# Patient Record
Sex: Female | Born: 1970 | Race: White | Hispanic: Yes | Marital: Married | Smoking: Never smoker
Health system: Southern US, Community
[De-identification: ages and names within clinical notes are randomized; demographics above are authoritative.]

## PROBLEM LIST (undated history)

## (undated) DIAGNOSIS — T7840XA Allergy, unspecified, initial encounter: Secondary | ICD-10-CM

## (undated) HISTORY — DX: Allergy, unspecified, initial encounter: T78.40XA

---

## 1998-10-08 ENCOUNTER — Ambulatory Visit (HOSPITAL_COMMUNITY): Admission: RE | Admit: 1998-10-08 | Discharge: 1998-10-08 | Payer: Self-pay | Admitting: Obstetrics

## 1998-10-08 ENCOUNTER — Other Ambulatory Visit: Admission: RE | Admit: 1998-10-08 | Discharge: 1998-10-08 | Payer: Self-pay | Admitting: Obstetrics

## 1999-01-22 ENCOUNTER — Ambulatory Visit (HOSPITAL_COMMUNITY): Admission: RE | Admit: 1999-01-22 | Discharge: 1999-01-22 | Payer: Self-pay | Admitting: Obstetrics

## 1999-01-22 ENCOUNTER — Encounter: Payer: Self-pay | Admitting: Obstetrics

## 1999-03-18 ENCOUNTER — Inpatient Hospital Stay (HOSPITAL_COMMUNITY): Admission: AD | Admit: 1999-03-18 | Discharge: 1999-03-20 | Payer: Self-pay | Admitting: *Deleted

## 2002-05-18 ENCOUNTER — Inpatient Hospital Stay (HOSPITAL_COMMUNITY): Admission: AD | Admit: 2002-05-18 | Discharge: 2002-05-20 | Payer: Self-pay | Admitting: Obstetrics and Gynecology

## 2005-05-10 ENCOUNTER — Inpatient Hospital Stay (HOSPITAL_COMMUNITY): Admission: AD | Admit: 2005-05-10 | Discharge: 2005-05-12 | Payer: Self-pay | Admitting: Obstetrics

## 2005-05-11 ENCOUNTER — Encounter (INDEPENDENT_AMBULATORY_CARE_PROVIDER_SITE_OTHER): Payer: Self-pay | Admitting: Specialist

## 2009-02-01 ENCOUNTER — Emergency Department (HOSPITAL_COMMUNITY): Admission: EM | Admit: 2009-02-01 | Discharge: 2009-02-01 | Payer: Self-pay | Admitting: Family Medicine

## 2011-02-14 NOTE — Op Note (Signed)
Ashley Blackburn, VOSLER              ACCOUNT NO.:  [id number]   MEDICAL RECORD NO.:  [id number]          PATIENT TYPE:  INP   LOCATION:  9145                          FACILITY:  [facility]   PHYSICIAN:  Kathreen Cosier, M.D.DATE OF BIRTH:  November 16, 1970   DATE OF PROCEDURE:  05/11/2005  DATE OF DISCHARGE:                                 OPERATIVE REPORT   PREOPERATIVE DIAGNOSIS:  Multiparity.   OPERATION/PROCEDURE:  Postpartum tubal ligation.   ANESTHESIA:  Epidural.   DESCRIPTION OF PROCEDURE:  The patient was placed in the supine position,  abdomen was prepped and draped.  Bladder emptied with straight catheter.  A  one-inch midline subumbilical incision made, carried down to the fascia.  Fascia was cleaned and grasped with two Kochers.  The fascia and peritoneum  opened with the Mayo scissors.  The left tube was grasped in the mid portion  with the Babcock clamp.  A 0 plain suture on the mesosalpinx below the  portion of the tube within the clamp.  This was tied.  Approximately one  inch of tube was transected.  Hemostasis satisfactory.  Procedure was done  in similar fashion on the other side.  Lap and sponge counts correct.  Abdomen closed in layers.  Peritoneum and fascia closed with continuous #1  Dexon.  Skin closed with subcuticular stitch of 4-0 Monocryl.  Blood loss  less than 5 mL.  The patient tolerated the procedure well and taken to the  recovery room in good condition.       BAM/MEDQ  D:  05/11/2005  T:  05/12/2005  Job:  [id number]

## 2011-02-14 NOTE — Discharge Summary (Signed)
NAMEJENNAVECIA, SCHWIER              ACCOUNT NO.:  [id number]   MEDICAL RECORD NO.:  [id number]          PATIENT TYPE:  INP   LOCATION:  9145                          FACILITY:  [facility]   PHYSICIAN:  Kathreen Cosier, M.D.DATE OF BIRTH:  1971/06/11   DATE OF ADMISSION:  05/10/2005  DATE OF DISCHARGE:                                 DISCHARGE SUMMARY   The patient is a 40 year old gravida 4, para 0, 0-0-3 with EDC of August, 8,  2006 chosen for induction at 41 weeks.  Cervix 1 cm, 90%, vertex, -3.  Negative GBS.  The patient had a normal vaginal delivery on May 10, 2005.  Apgar 8, 9, female.  Desired sterilization.  On May 11, 2005, underwent  postpartum tubal ligation.  Postoperatively, her hemoglobin was 12.7.  She  was discharged on May 12, 2005, the second postpartum day, ambulatory, on  a regular diet.  To see me in six weeks.   DISCHARGE DIAGNOSES:  1.  Status post normal vaginal delivery at term.  2.  Postpartum tubal ligation.       BAM/MEDQ  D:  05/12/2005  T:  05/12/2005  Job:  [id number]

## 2012-02-19 ENCOUNTER — Ambulatory Visit (INDEPENDENT_AMBULATORY_CARE_PROVIDER_SITE_OTHER): Payer: BC Managed Care – PPO | Admitting: Family Medicine

## 2012-02-19 VITALS — BP 134/86 | HR 76 | Temp 98.5°F | Resp 16 | Ht 60.75 in | Wt 175.4 lb

## 2012-02-19 DIAGNOSIS — J45909 Unspecified asthma, uncomplicated: Secondary | ICD-10-CM

## 2012-02-19 MED ORDER — METHYLPREDNISOLONE ACETATE 80 MG/ML IJ SUSP
80.0000 mg | Freq: Once | INTRAMUSCULAR | Status: AC
  Administered 2012-02-19: 80 mg via INTRAMUSCULAR

## 2012-02-19 NOTE — Progress Notes (Signed)
  Subjective:    Patient ID: Ashley Blackburn, female    DOB: [birth year], 41 y.o.   MRN: [id number]  HPI 41 yo female here with URI symptoms. Difficulty with allergies.  RUnny nose, sob (uses inhaler), eyes irritated.  SOB will cause cough.  No fever.  Bodyaches.  Going on for about a month.  Nasal congestion and eyes started several days ago.  Allegra helps some.    Review of Systems    Negative except as per HPI  Objective:   Physical Exam  Constitutional: She appears well-developed. No distress.  HENT:  Right Ear: Tympanic membrane, external ear and ear canal normal. Tympanic membrane is not injected, not scarred, not perforated, not erythematous, not retracted and not bulging.  Left Ear: Tympanic membrane, external ear and ear canal normal. Tympanic membrane is not injected, not scarred, not perforated, not erythematous, not retracted and not bulging.  Nose: Mucosal edema present. No rhinorrhea. Right sinus exhibits no maxillary sinus tenderness and no frontal sinus tenderness. Left sinus exhibits maxillary sinus tenderness. Left sinus exhibits no frontal sinus tenderness.  Mouth/Throat: Uvula is midline, oropharynx is clear and moist and mucous membranes are normal. No oropharyngeal exudate or tonsillar abscesses.  Cardiovascular: Normal rate, regular rhythm, normal heart sounds and intact distal pulses.   No murmur heard. Pulmonary/Chest: Effort normal and breath sounds normal. No respiratory distress. She has no wheezes. She has no rales.  Lymphadenopathy:       Head (right side): No submandibular and no preauricular adenopathy present.       Head (left side): No submandibular and no preauricular adenopathy present.       Right cervical: No superficial cervical and no posterior cervical adenopathy present.      Left cervical: No superficial cervical and no posterior cervical adenopathy present.       Right: No supraclavicular adenopathy present.       Left: No supraclavicular  adenopathy present.  Skin: Skin is warm and dry.          Assessment & Plan:  Allergies, asthma - depomedrol here.  Continue allegra

## 2013-01-21 ENCOUNTER — Ambulatory Visit (INDEPENDENT_AMBULATORY_CARE_PROVIDER_SITE_OTHER): Payer: BC Managed Care – PPO | Admitting: Emergency Medicine

## 2013-01-21 VITALS — BP 100/80 | HR 77 | Temp 98.0°F | Resp 18 | Wt 181.0 lb

## 2013-01-21 DIAGNOSIS — J309 Allergic rhinitis, unspecified: Secondary | ICD-10-CM

## 2013-01-21 DIAGNOSIS — J209 Acute bronchitis, unspecified: Secondary | ICD-10-CM

## 2013-01-21 MED ORDER — FLUTICASONE PROPIONATE 50 MCG/ACT NA SUSP: 2.0000 | Freq: Every day | NASAL | Status: AC

## 2013-01-21 MED ORDER — ALBUTEROL SULFATE HFA 108 (90 BASE) MCG/ACT IN AERS: 2.0000 | INHALATION_SPRAY | RESPIRATORY_TRACT | Status: AC | PRN

## 2013-01-21 NOTE — Patient Instructions (Addendum)
Rinitis Alrgica (Allergic Rhinitis) La rinitis alrgica aparece cuando las membranas mucosas de la nariz reaccionan a los alrgenos. Los alrgenos son las partculas que estn en el aire y a las que el organismo responde cuando existe una reaccin alrgica. Esto hace que usted libere anticuerpos de alergia. A travs de una sucesin de procesos, finalmente se libera histamina (de ah el uso de antihistamnicos) en el torrente sanguneo. Aunque esto implica una proteccin para su organismo, es lo que le produce las molestias., como estornudos frecuentes, congestin, picazn y goteos de la nariz.  CAUSAS Los alergenos del polen pueden provenir del csped, rboles y hierbas. Esto produce la rinitis alrgica estacional, o "fiebre de heno". Otras alrgenos pueden ocasionar rinitis alrgica persistente (rinitis alrgica perenne) como aquellos que contienen los caros del polvo del hogar, el pelaje de las mascotas y las esporas del moho.  SNTOMAS  Congestin nasal.  Picazn y goteo de la nariz con estornudos y lagrimeo de los ojos.  Generalmente, tambin puede haber picazn de la boca, ojos y odos. Las alergias no pueden curarse pero pueden controlarse con medicamentos. DIAGNSTICO Si no reconoce exactamente cul es el alrgeno que le ocasiona el problema, podrn realizarle pruebas de sangre, o de piel para determinarlo. TRATAMIENTO  Evite el alrgeno.  Podrn ser tiles medicamentos y vacunas para la alergia (inmunoterapia).  Con frecuencia la fiebre de heno se trata simplemente con antihistamnicos en forma de pldoras o sprays nasales. Los antihistamnicos bloquean los efectos de la histamina. Existen medicamentos de venta libre que lo ayudarn a aliviar la picazn, la congestin nasal y la hinchazn alrededor de los ojos. Consulte con el profesional antes de tomar o administrar estos medicamentos. Si estos medicamentos no le resultan efectivos, existen muchos otros nuevos que el profesional que  lo asiste puede prescribirle. Si las medidas iniciales no son efectivas, podrn utilizarse medicamentos ms fuertes. Las inyecciones desensibilizantes pueden utilizarse si los otros medicamentos fracasan. La desensibilizacin aparece cuando un paciente recibe inyecciones continuas hasta que el cuerpo se vuelve menos sensible al alrgeno. Asegrese de realizar un seguimiento con el profesional que lo asiste si los problemas continan. SOLICITE ANTENCIN MDICA SI:   Le sube la temperatura a ms de 100.5 F (38.1 C).  Presenta tos que no se alivia (persistente).  Le falta el aire.  Comienza a respirar con dificultad.  Los sntomas interfieren con las actividades diarias. Document Released: 06/25/2005 Document Revised: 12/08/2011 [employer] Patient Information 2013 [employer].  

## 2013-01-21 NOTE — Progress Notes (Signed)
[facility] [address] [place] [address] [phone]  Date:  01/21/2013   Name:  Ashley Blackburn   DOB:  Dec 20, 1970   MRN:  [id number]  PCP:  No PCP Per Patient    Chief Complaint: Allergic Rhinitis    History of Present Illness:  Ashley Blackburn is a 42 y.o. very pleasant female patient who presents with the following:  Ill since yesterday with cough and nasal congestion.  Has some exertional shortness of breath and wheezing.  Nasal discharge is clear.  Cough is non productive.  No fever or chills.  No nausea or vomiting.  No stool change.  No improvement with over the counter medications or other home remedies. Denies other complaint or health concern today.   There is no problem list on file for this patient.   Past Medical History  Diagnosis Date  . Allergy     No past surgical history on file.  History  Substance Use Topics  . Smoking status: Never Smoker   . Smokeless tobacco: Not on file  . Alcohol Use: No    No family history on file.  No Known Allergies  Medication list has been reviewed and updated.  Current Outpatient Prescriptions on File Prior to Visit  Medication Sig Dispense Refill  . albuterol (PROVENTIL HFA;VENTOLIN HFA) 108 (90 BASE) MCG/ACT inhaler Inhale 2 puffs into the lungs as needed.       No current facility-administered medications on file prior to visit.    Review of Systems:  As per HPI, otherwise negative.    Physical Examination: Filed Vitals:   01/21/13 1733  BP: 100/80  Pulse: 77  Temp: 98 F (36.7 C)  Resp: 18   Filed Vitals:   01/21/13 1733  Weight: 181 lb (82.101 kg)   Body mass index is 34.48 kg/(m^2). Ideal Body Weight:    GEN: WDWN, NAD, Non-toxic, A & O x 3 HEENT: Atraumatic, Normocephalic. Neck supple. No masses, No LAD. Ears and Nose: No external deformity. CV: RRR, No M/G/R. No JVD. No thrill. No extra heart sounds. PULM: CTA B,  Diffuse wheezes, no  crackles, rhonchi. No  retractions. No resp. distress. No accessory muscle use. ABD: S, NT, ND, +BS. No rebound. No HSM. EXTR: No c/c/e NEURO Normal gait.  PSYCH: Normally interactive. Conversant. Not depressed or anxious appearing.  Calm demeanor.    Assessment and Plan: Seasonal allergic rhinitis Bronchospasm Albuterol flonase Continue claritin  Signed,  Phillips Odor, MD

## 2019-03-09 ENCOUNTER — Other Ambulatory Visit: Payer: Self-pay

## 2021-01-30 ENCOUNTER — Other Ambulatory Visit: Payer: Self-pay | Admitting: Internal Medicine

## 2021-01-30 DIAGNOSIS — Z1231 Encounter for screening mammogram for malignant neoplasm of breast: Secondary | ICD-10-CM

## 2021-02-27 ENCOUNTER — Ambulatory Visit: Payer: Self-pay | Admitting: Women's Health

## 2021-03-09 ENCOUNTER — Encounter (HOSPITAL_COMMUNITY): Payer: Self-pay | Admitting: Emergency Medicine

## 2021-03-09 ENCOUNTER — Other Ambulatory Visit: Payer: Self-pay

## 2021-03-09 ENCOUNTER — Emergency Department (HOSPITAL_COMMUNITY)
Admission: EM | Admit: 2021-03-09 | Discharge: 2021-03-10 | Disposition: A | Discharge status: Home / Self Care | Payer: BC Managed Care – PPO | Attending: Emergency Medicine | Admitting: Emergency Medicine

## 2021-03-09 ENCOUNTER — Ambulatory Visit (HOSPITAL_COMMUNITY)
Admission: EM | Admit: 2021-03-09 | Discharge: 2021-03-09 | Disposition: A | Discharge status: Home / Self Care | Payer: BC Managed Care – PPO | Attending: Urgent Care | Admitting: Urgent Care

## 2021-03-09 ENCOUNTER — Emergency Department (HOSPITAL_COMMUNITY): Payer: BC Managed Care – PPO

## 2021-03-09 DIAGNOSIS — R1011 Right upper quadrant pain: Secondary | ICD-10-CM

## 2021-03-09 DIAGNOSIS — R1013 Epigastric pain: Secondary | ICD-10-CM

## 2021-03-09 DIAGNOSIS — K8020 Calculus of gallbladder without cholecystitis without obstruction: Secondary | ICD-10-CM | POA: Diagnosis not present

## 2021-03-09 LAB — CBC WITH DIFFERENTIAL/PLATELET
Abs Immature Granulocytes: 0.02 10*3/uL (ref 0.00–0.07)
Basophils Absolute: 0 10*3/uL (ref 0.0–0.1)
Basophils Relative: 1 %
Eosinophils Absolute: 0.2 10*3/uL (ref 0.0–0.5)
Eosinophils Relative: 2 %
HCT: 43 % (ref 36.0–46.0)
Hemoglobin: 14.2 g/dL (ref 12.0–15.0)
Immature Granulocytes: 0 %
Lymphocytes Relative: 29 %
Lymphs Abs: 2.4 10*3/uL (ref 0.7–4.0)
MCH: 30 pg (ref 26.0–34.0)
MCHC: 33 g/dL (ref 30.0–36.0)
MCV: 90.7 fL (ref 80.0–100.0)
Monocytes Absolute: 0.6 10*3/uL (ref 0.1–1.0)
Monocytes Relative: 7 %
Neutro Abs: 4.9 10*3/uL (ref 1.7–7.7)
Neutrophils Relative %: 61 %
Platelets: 314 10*3/uL (ref 150–400)
RBC: 4.74 MIL/uL (ref 3.87–5.11)
RDW: 13.3 % (ref 11.5–15.5)
WBC: 8.1 10*3/uL (ref 4.0–10.5)
nRBC: 0 % (ref 0.0–0.2)

## 2021-03-09 LAB — URINALYSIS, ROUTINE W REFLEX MICROSCOPIC
Bacteria, UA: NONE SEEN
Bilirubin Urine: NEGATIVE
Glucose, UA: NEGATIVE mg/dL
Ketones, ur: NEGATIVE mg/dL
Leukocytes,Ua: NEGATIVE
Nitrite: NEGATIVE
Protein, ur: NEGATIVE mg/dL
RBC / HPF: >50 RBC/hpf — ABNORMAL HIGH (ref 0–5)
Specific Gravity, Urine: 1.015 (ref 1.005–1.030)
pH: 5 (ref 5.0–8.0)

## 2021-03-09 LAB — COMPREHENSIVE METABOLIC PANEL
ALT: 18 U/L (ref 0–44)
AST: 17 U/L (ref 15–41)
Albumin: 3.9 g/dL (ref 3.5–5.0)
Alkaline Phosphatase: 64 U/L (ref 38–126)
Anion gap: 6 (ref 5–15)
BUN: 7 mg/dL (ref 6–20)
CO2: 29 mmol/L (ref 22–32)
Calcium: 9.1 mg/dL (ref 8.9–10.3)
Chloride: 102 mmol/L (ref 98–111)
Creatinine, Ser: 0.61 mg/dL (ref 0.44–1.00)
GFR, Estimated: >60 mL/min (ref >60)
Glucose, Bld: 93 mg/dL (ref 70–99)
Potassium: 4.1 mmol/L (ref 3.5–5.1)
Sodium: 137 mmol/L (ref 135–145)
Total Bilirubin: 1.1 mg/dL (ref 0.3–1.2)
Total Protein: 6.9 g/dL (ref 6.5–8.1)

## 2021-03-09 LAB — I-STAT BETA HCG BLOOD, ED (MC, WL, AP ONLY): I-stat hCG, quantitative: <5 m[IU]/mL (ref <5)

## 2021-03-09 LAB — LIPASE, BLOOD: Lipase: 22 U/L (ref 11–51)

## 2021-03-09 MED ORDER — HYDROCODONE-ACETAMINOPHEN 5-325 MG PO TABS
1.0000 | ORAL_TABLET | Freq: Once | ORAL | Status: AC
  Administered 2021-03-09: 1 via ORAL

## 2021-03-09 MED ORDER — HYDROCODONE-ACETAMINOPHEN 5-325 MG PO TABS
ORAL_TABLET | ORAL | Status: AC
  Filled 2021-03-09: qty 1

## 2021-03-09 MED ORDER — FENTANYL CITRATE (PF) 100 MCG/2ML IJ SOLN
75.0000 ug | Freq: Once | INTRAMUSCULAR | Status: AC
Start: 2021-03-09 — End: 2021-03-09
  Administered 2021-03-09: 75 ug via INTRAVENOUS
  Filled 2021-03-09: qty 2

## 2021-03-09 MED ORDER — ONDANSETRON HCL 4 MG/2ML IJ SOLN
4.0000 mg | Freq: Once | INTRAMUSCULAR | Status: AC
Start: 2021-03-09 — End: 2021-03-09
  Administered 2021-03-09: 4 mg via INTRAVENOUS
  Filled 2021-03-09: qty 2

## 2021-03-09 MED ORDER — HYDROCODONE-ACETAMINOPHEN 5-325 MG PO TABS
1.0000 | ORAL_TABLET | Freq: Once | ORAL | Status: AC
Start: 2021-03-09 — End: 2021-03-10
  Administered 2021-03-10: 1 via ORAL
  Filled 2021-03-09: qty 1

## 2021-03-09 MED ORDER — HYDROCODONE-ACETAMINOPHEN 5-325 MG PO TABS: 1.0000 | ORAL_TABLET | Freq: Three times a day (TID) | ORAL | 0 refills | Status: AC | PRN

## 2021-03-09 MED ORDER — KETOROLAC TROMETHAMINE 30 MG/ML IJ SOLN
30.0000 mg | Freq: Once | INTRAMUSCULAR | Status: AC
  Administered 2021-03-09: 30 mg via INTRAVENOUS
  Filled 2021-03-09: qty 1

## 2021-03-09 MED ORDER — ONDANSETRON 4 MG PO TBDP: 4.0000 mg | ORAL_TABLET | Freq: Three times a day (TID) | ORAL | 0 refills | Status: AC | PRN

## 2021-03-09 NOTE — ED Triage Notes (Signed)
Pt endorses mid and right sided abd pain intermittently that worsened yesterday. Endorses n/v

## 2021-03-09 NOTE — ED Provider Notes (Signed)
[facility]  MRN: [id number] DOB: 1971-05-19  Subjective:   Ashley Blackburn is a 50 y.o. female presenting for 3 to 4-day history of acute onset severe 9 out of 10 abdominal pain over the right upper quadrant, right flank side.  Patient has a difficult time taking a deep breath, pain is elicited and worsened after eating.  She has concerns about gallbladder disease and has seen multiple providers for this but reports that no one has ever done blood work or imaging.  Also has nausea without vomiting.  Denies fever, diarrhea, bloody stools, history of GI issues, cough, chest pain, shortness of breath.  Patient does not practice a healthy diet.  No current facility-administered medications for this encounter.  Current Outpatient Medications:    albuterol (PROVENTIL HFA;VENTOLIN HFA) 108 (90 BASE) MCG/ACT inhaler, Inhale 2 puffs into the lungs every 4 (four) hours as needed., Disp: 18 g, Rfl: 12   fluticasone (FLONASE) 50 MCG/ACT nasal spray, Place 2 sprays into the nose daily., Disp: 16 g, Rfl: 12   loratadine (CLARITIN) 10 MG tablet, Take 10 mg by mouth daily., Disp: , Rfl:    No Known Allergies  Past Medical History:  Diagnosis Date   Allergy      History reviewed. No pertinent surgical history.  History reviewed. No pertinent family history.  Social History   Tobacco Use   Smoking status: Never  Substance Use Topics   Alcohol use: No   Drug use: No    ROS   Objective:   Vitals: BP 135/85 (BP Location: Right Arm)   Pulse 72   Resp 16   LMP 03/08/2021 (Exact Date)   SpO2 98%   Physical Exam Constitutional:      General: She is not in acute distress.    Appearance: Normal appearance. She is well-developed. She is obese. She is not ill-appearing, toxic-appearing or diaphoretic.  HENT:     Head: Normocephalic and atraumatic.     Right Ear: External ear normal.     Left Ear: External ear normal.     Nose: Nose normal.     Mouth/Throat:     Mouth:  Mucous membranes are moist.     Pharynx: Oropharynx is clear.  Eyes:     General: No scleral icterus.    Extraocular Movements: Extraocular movements intact.     Pupils: Pupils are equal, round, and reactive to light.  Cardiovascular:     Rate and Rhythm: Normal rate and regular rhythm.     Pulses: Normal pulses.     Heart sounds: Normal heart sounds. No murmur heard.   No friction rub. No gallop.  Pulmonary:     Effort: Pulmonary effort is normal. No respiratory distress.     Breath sounds: Normal breath sounds. No stridor. No wheezing, rhonchi or rales.  Abdominal:     General: Bowel sounds are normal. There is no distension.     Palpations: Abdomen is soft. There is no mass.     Tenderness: There is abdominal tenderness in the right upper quadrant and right lower quadrant. There is no right CVA tenderness, left CVA tenderness, guarding or rebound.  Skin:    General: Skin is warm and dry.     Coloration: Skin is not pale.     Findings: No rash.  Neurological:     General: No focal deficit present.     Mental Status: She is alert and oriented to person, place, and time.  Psychiatric:  Mood and Affect: Mood normal.        Behavior: Behavior normal.        Thought Content: Thought content normal.        Judgment: Judgment normal.      Assessment and Plan :   PDMP not reviewed this encounter.  1. Right upper quadrant abdominal pain     Patient has severe abdominal pain and guarding on exam.  I offered her a comprehensive metabolic panel but patient prefers imaging which we cannot perform.  Recommended evaluation in the emergency room for further work-up and rule out of an acute abdomen, acute gallbladder disease.  Patient given hydrocodone in clinic for severe pain, will report to the emergency room by personal vehicle.   Wallis Bamberg, [place] 03/09/21 1405

## 2021-03-09 NOTE — ED Triage Notes (Signed)
Pt said she has been having RUQ pain that radiates around to her back. Pt said when she eats it makes it worse. Some nausea, no vomiting.

## 2021-03-09 NOTE — ED Provider Notes (Signed)
Emergency Medicine Provider Triage Evaluation Note  Ashley Blackburn , a 50 y.o. female  was evaluated in triage.  Pt complains of epigastric RUQ abdominal pain, nausea, vomiting 3 days. History of similar pain in the past that has gone away on its own. No abdominal surgeries. No fever, CP, cough, diarrhea, dysuria.   Review of Systems  Positive: Abd pain nv Negative: Fever CP SOB flank pain dysuria diarrhea  Physical Exam  BP 134/86 (BP Location: Left Arm)   Pulse 64   Temp 98.3 F (36.8 C) (Oral)   Resp 18   LMP 03/08/2021 (Exact Date)   SpO2 97%  Gen:   Awake, no distress   Resp:  Normal effort  MSK:   Moves extremities without difficulty  Abd:  RUQ epigastric abd tenderness  Medical Decision Making  Medically screening exam initiated at 2:31 PM.  Appropriate orders placed.     Patient made aware this encounter is a triage and screening encounter and no beds are immediately available at this time in the ER.  Patient was informed that the remainder of the evaluation will be completed by another provider.  Patient made aware triage orders have been placed and patient will be placed in the waiting room while work up is initiated and until a room becomes available. Patient encouraged to await a formal ER encounter with a clinician.  Patient made aware that exiting the department prior to formal encounter with an ER clinician and completion of the work-up is considered leaving against medical advice.  At that time there is no guarantee that there are no emergency medical conditions present and patient assumes risks of leaving including worsening condition, permanent disability and death. Patient verbalizes understanding.     Liberty Handy, PA-C 03/09/21 1432    Benjiman Core, MD 03/09/21 [phone]

## 2021-03-09 NOTE — ED Provider Notes (Signed)
[facility] EMERGENCY DEPARTMENT Provider Note   CSN: [id number] Arrival date & time: 03/09/21  1346     History Chief Complaint  Patient presents with   Abdominal Pain    Ashley Blackburn is a 50 y.o. female presents for evaluation of right upper quadrant abdominal pain that began yesterday.  She reports she was at work when the pain started.  She had eaten rice and fried chicken.  She states that the pain has been constant since then.  Currently rates it at a 7/10.  She has had some nausea/vomiting.  She reports she has had episodes of this pain intermittently over the last several months but yesterday's episode was worse.  She has not sought evaluation for her symptoms.  She has not had any fevers, denies any chest pain, difficulty breathing, dysuria, hematuria.  She is currently on her menstrual cycle.  The history is provided by the patient.      Past Medical History:  Diagnosis Date   Allergy     There are no problems to display for this patient.   History reviewed. No pertinent surgical history.   OB History   No obstetric history on file.     No family history on file.  Social History   Tobacco Use   Smoking status: Never  Substance Use Topics   Alcohol use: No   Drug use: No    Home Medications Prior to Admission medications   Medication Sig Start Date End Date Taking? Authorizing Provider  HYDROcodone-acetaminophen (NORCO/VICODIN) 5-325 MG tablet Take 1 tablet by mouth every 8 (eight) hours as needed. 03/09/21  Yes Graciella Freer A, PA-C  ondansetron (ZOFRAN ODT) 4 MG disintegrating tablet Take 1 tablet (4 mg total) by mouth every 8 (eight) hours as needed for nausea or vomiting. 03/09/21  Yes Graciella Freer A, PA-C  albuterol (PROVENTIL HFA;VENTOLIN HFA) 108 (90 BASE) MCG/ACT inhaler Inhale 2 puffs into the lungs every 4 (four) hours as needed. 01/21/13   Carmelina Dane, MD  fluticasone [facility]) 50 MCG/ACT nasal spray Place 2 sprays  into the nose daily. 01/21/13   Carmelina Dane, MD  loratadine (CLARITIN) 10 MG tablet Take 10 mg by mouth daily.    [provider]    Allergies    Patient has no known allergies.  Review of Systems   Review of Systems  Constitutional:  Negative for fever.  Respiratory:  Negative for cough and shortness of breath.   Cardiovascular:  Negative for chest pain.  Gastrointestinal:  Positive for abdominal pain, nausea and vomiting.  Genitourinary:  Negative for dysuria and hematuria.  Neurological:  Negative for headaches.  All other systems reviewed and are negative.  Physical Exam Updated Vital Signs BP 135/79   Pulse (!) 59   Temp 97.7 F (36.5 C)   Resp 19   LMP 03/08/2021 (Exact Date)   SpO2 93%   Physical Exam Vitals and nursing note reviewed.  Constitutional:      Appearance: Normal appearance. She is well-developed.  HENT:     Head: Normocephalic and atraumatic.  Eyes:     General: Lids are normal.     Conjunctiva/sclera: Conjunctivae normal.     Pupils: Pupils are equal, round, and reactive to light.  Cardiovascular:     Rate and Rhythm: Normal rate and regular rhythm.     Pulses: Normal pulses.     Heart sounds: Normal heart sounds. No murmur heard.   No friction rub. No gallop.  Pulmonary:     Effort: Pulmonary effort is normal.     Breath sounds: Normal breath sounds.     Comments: Lungs clear to auscultation bilaterally.  Symmetric chest rise.  No wheezing, rales, rhonchi. Abdominal:     Palpations: Abdomen is soft. Abdomen is not rigid.     Tenderness: There is abdominal tenderness in the right upper quadrant. There is no right CVA tenderness, left CVA tenderness or guarding. Negative signs include McBurney's sign.     Comments: Abdomen is soft, nondistended.  Tenderness palpation in right upper quadrant.  Mild Murphy sign.  No CVA tenderness noted bilaterally.  Musculoskeletal:        General: Normal range of motion.     Cervical back: Full  passive range of motion without pain.  Skin:    General: Skin is warm and dry.     Capillary Refill: Capillary refill takes less than 2 seconds.  Neurological:     Mental Status: She is alert and oriented to person, place, and time.  Psychiatric:        Speech: Speech normal.    ED Results / Procedures / Treatments   Labs (all labs ordered are listed, but only abnormal results are displayed) Labs Reviewed  URINALYSIS, ROUTINE W REFLEX MICROSCOPIC - Abnormal; Notable for the following components:      Result Value   APPearance HAZY (*)    Hgb urine dipstick LARGE (*)    RBC / HPF >50 (*)    All other components within normal limits  CBC WITH DIFFERENTIAL/PLATELET  COMPREHENSIVE METABOLIC PANEL  LIPASE, BLOOD  I-STAT BETA HCG BLOOD, ED ([facility], [facility], [facility] ONLY)    EKG None  Radiology US Abdomen Limited RUQ (LIVER/GB)  Result Date: 03/09/2021 CLINICAL DATA:  Epigastric pain for 4 days. EXAM: ULTRASOUND ABDOMEN LIMITED RIGHT UPPER QUADRANT COMPARISON:  None. FINDINGS: Gallbladder: Calcified gallstones are noted within the gallbladder lumen. No associated gallbladder wall thickening or pericholecystic fluid visualized. No sonographic Murphy sign noted by sonographer. Common bile duct: Diameter: 3 mm. Liver: No focal lesion identified. Increased parenchymal echogenicity. Portal vein is patent on color Doppler imaging with normal direction of blood flow towards the liver. Other: None. IMPRESSION: 1. Cholelithiasis with no associated acute cholecystitis or choledocholithiasis. 2. Hepatic steatosis. Please note limited evaluation for focal hepatic masses in a patient with hepatic steatosis due to decreased penetration of the acoustic ultrasound waves. Electronically Signed   By: Tish Frederickson M.D.   On: 03/09/2021 15:16    Procedures Procedures   Medications Ordered in ED Medications  fentaNYL (SUBLIMAZE) injection 75 mcg (75 mcg Intravenous Given 03/09/21 2216)  ondansetron (ZOFRAN)  injection 4 mg (4 mg Intravenous Given 03/09/21 2215)  ketorolac (TORADOL) 30 MG/ML injection 30 mg (30 mg Intravenous Given 03/09/21 2224)  HYDROcodone-acetaminophen (NORCO/VICODIN) 5-325 MG per tablet 1 tablet (1 tablet Oral Given 03/10/21 0035)    ED Course  I have reviewed the triage vital signs and the nursing notes.  Pertinent labs & imaging results that were available during my care of the patient were reviewed by me and considered in my medical decision making (see chart for details).    MDM Rules/Calculators/A&P                          50 year old female who presents for evaluation of right upper quadrant abdominal pain, nausea/vomiting that began yesterday.  Has had intermittent episodes of this that have occurred over the last  several months.  This was the worst episode.  No fevers, urinary complaints.  Went to urgent care initially and got something for pain which she states improved it.  Now states pain is 7 out of 10.  On initial ED arrival, she is afebrile, nontoxic-appearing.  Vital signs are stable.  On exam, she has tenderness palpation noted to the right upper quadrant.  No CVA tenderness that would be concerning for kidney stone.  Consider hepatobiliary etiology.  Labs, ultrasound ordered at triage.  UA shows large hemoglobin.  No leukocytosis, pyuria.  Patient is currently on her menstrual cycle.  I-STAT beta is negative.  Lipase is normal.  CMP is unremarkable.  LFTs within normal limit.  CBC shows no leukocytosis or anemia.  Ultrasound shows cholelithiasis with no associated acute cholecystitis or choledocholithiasis.  Common bile duct is 3 mm.  Discussed results with patient.  At this time, she exhibits no signs of acute cholecystitis.  She is feeling better after pain medication.  We will plan to p.o. challenge.  Reevaluation.  Patient able to tolerate p.o.  Repeat abdominal exam is improved.  At this time, she is hemodynamically stable, afebrile, no leukocytosis with  reassuring signs on ultrasound.  We discussed extensively of diet measures to help with gallbladder pain.  We will give her pain medication to go home with as well as outpatient referral to general surgery. At this time, patient exhibits no emergent life-threatening condition that require further evaluation in ED. Patient had ample opportunity for questions and discussion. All patient's questions were answered with full understanding. Strict return precautions discussed. Patient expresses understanding and agreement to plan.   Portions of this note were generated with Scientist, clinical (histocompatibility and immunogenetics). Dictation errors may occur despite best attempts at proofreading.  Final Clinical Impression(s) / ED Diagnoses Final diagnoses:  Epigastric abdominal pain  Gallstones    Rx / DC Orders ED Discharge Orders          Ordered    HYDROcodone-acetaminophen (NORCO/VICODIN) 5-325 MG tablet  Every 8 hours PRN        03/09/21 2348    ondansetron (ZOFRAN ODT) 4 MG disintegrating tablet  Every 8 hours PRN        03/09/21 2348             Maxwell Caul, PA-C 03/10/21 1513    Jacalyn Lefevre, MD 03/10/21 1933

## 2021-03-09 NOTE — Discharge Instructions (Signed)
I am concerned that you may have an acute gall bladder problem and are in need of a higher level of care than we can provide in the urgent care setting. Please present to the emergency room for consideration of a CT abdomen or RUQ U/S to further rule out an acute abdomen, acute gall bladder problem.

## 2021-03-09 NOTE — ED Notes (Signed)
Patient is being discharged from the Urgent Care and sent to the Emergency Department via POV . Per Marlowe Shores, PA, patient is in need of higher level of care due to severe abdominal pain. Patient is aware and verbalizes understanding of plan of care.  Vitals:   03/09/21 1305  BP: 135/85  Pulse: 72  Resp: 16  SpO2: 98%

## 2021-03-09 NOTE — Discharge Instructions (Addendum)
As we discussed today, your labs and ultrasound were reassuring.  Your ultrasound did show evidence of gallstones which is likely contributing to your pain.  As we discussed at this time, there is no signs of infection.  As we discussed, changing eating habits can help with gallbladder pain.  We have provided you with pain medication and nausea medication that you can have if you start having this pain.  Additionally, we have given you referral to surgery.  Please follow-up with them and call their office to arrange follow-up.  At some point, if you continue have symptoms your gallbladder may need to be removed.  Return to the emergency department for any worsening abdominal pain, fevers, nausea/vomiting.

## 2021-03-27 ENCOUNTER — Other Ambulatory Visit: Payer: Self-pay

## 2021-03-27 ENCOUNTER — Ambulatory Visit
Admission: RE | Admit: 2021-03-27 | Discharge: 2021-03-27 | Disposition: A | Discharge status: Home / Self Care | Payer: BC Managed Care – PPO | Source: Ambulatory Visit | Attending: Internal Medicine | Admitting: Internal Medicine

## 2021-03-27 DIAGNOSIS — Z1231 Encounter for screening mammogram for malignant neoplasm of breast: Secondary | ICD-10-CM

## 2022-05-05 IMAGING — US US ABDOMEN LIMITED
1 series · 14 of 25 positions shown · non-contrast
Comparison: None.

CLINICAL DATA: Epigastric pain for 4 days.

EXAM:
ULTRASOUND ABDOMEN LIMITED RIGHT UPPER QUADRANT

[Series 1: us abdomen limited ruq (liver/gb) · 14 of 31 slices shown]
[im 1/31]
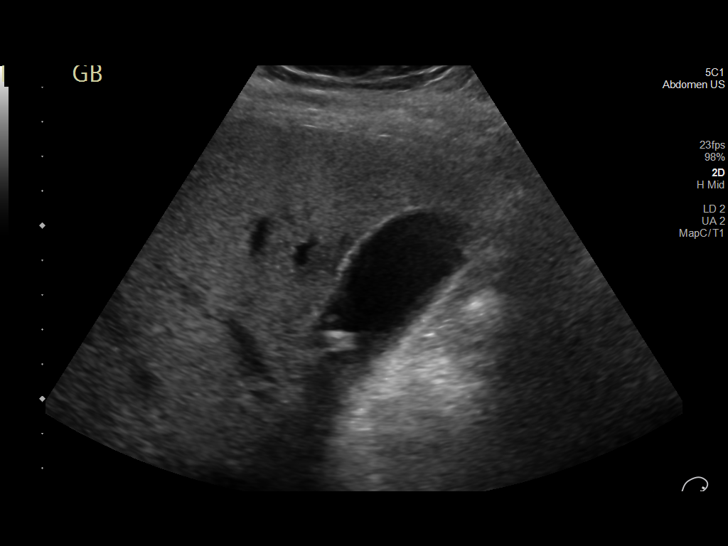
[im 3/31]
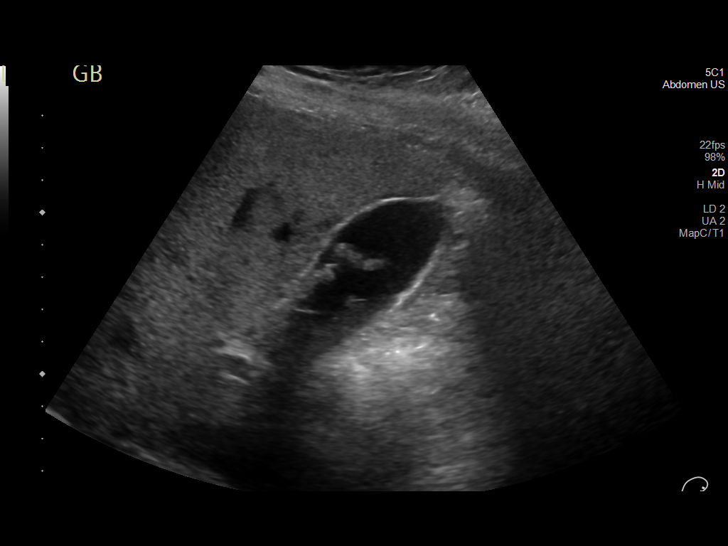
[im 6/31]
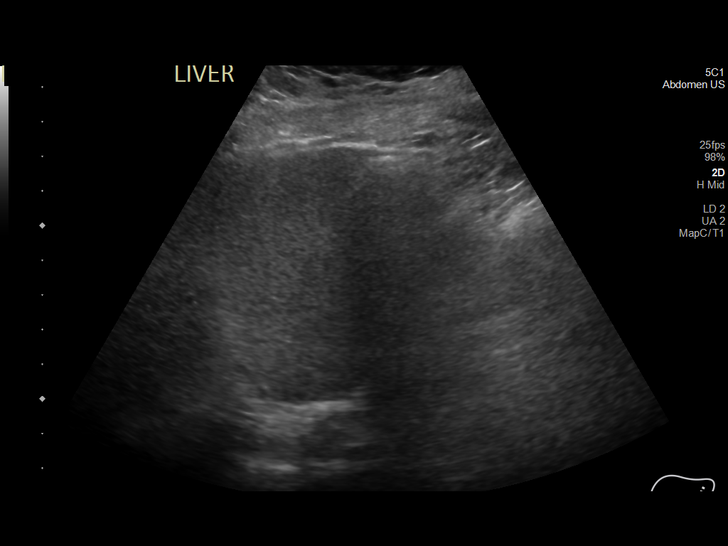
[im 8/31]
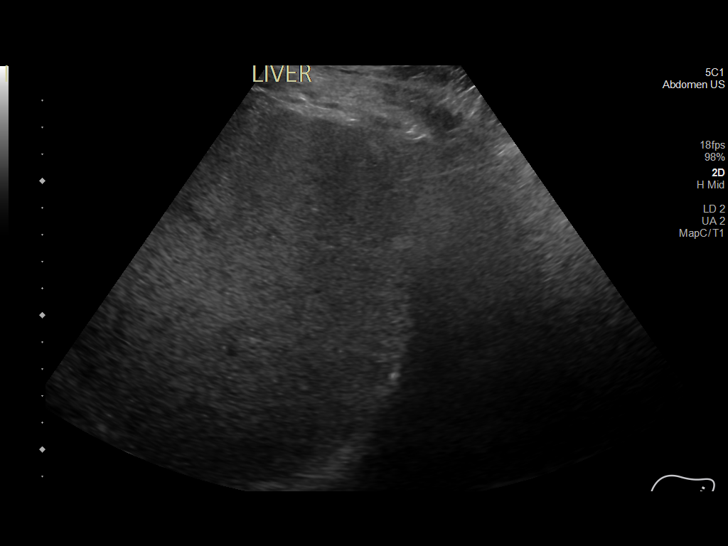
[im 11/31]
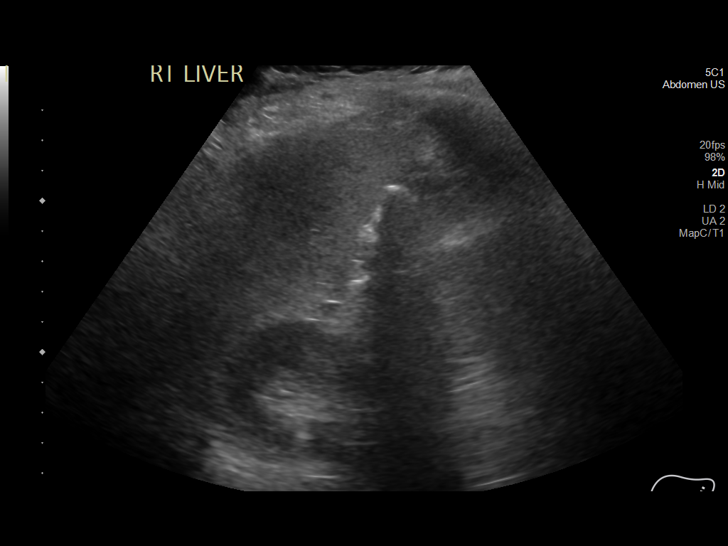
[im 12/31]
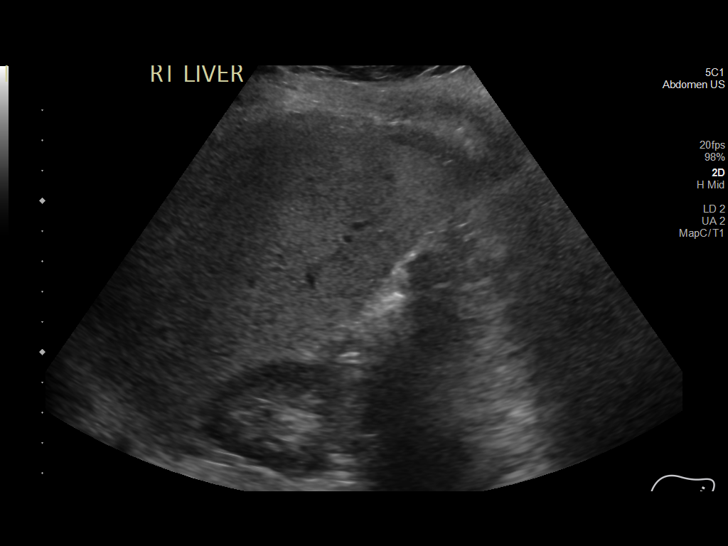
[im 14/31]
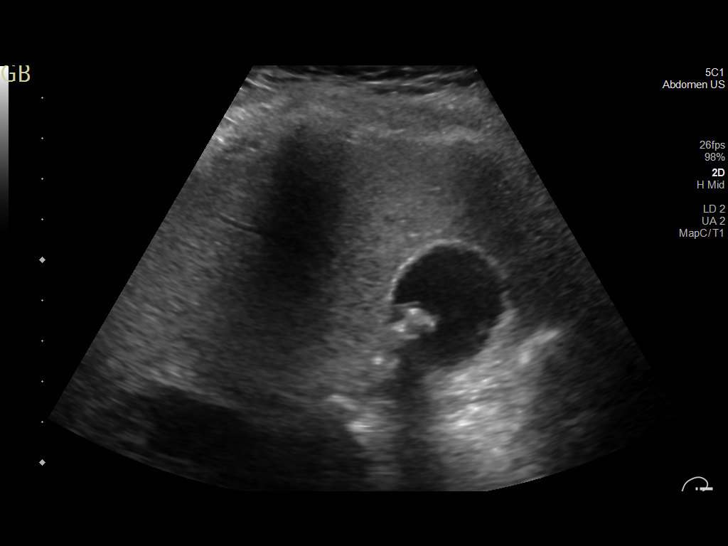
[im 17/31]
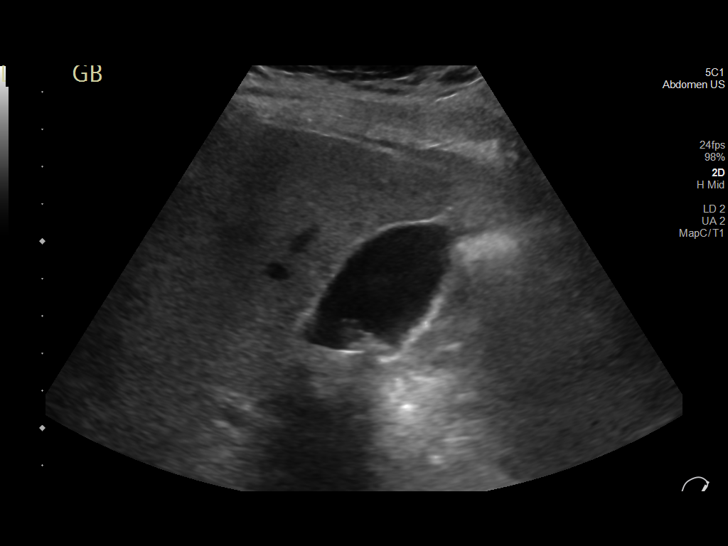
[im 19/31]
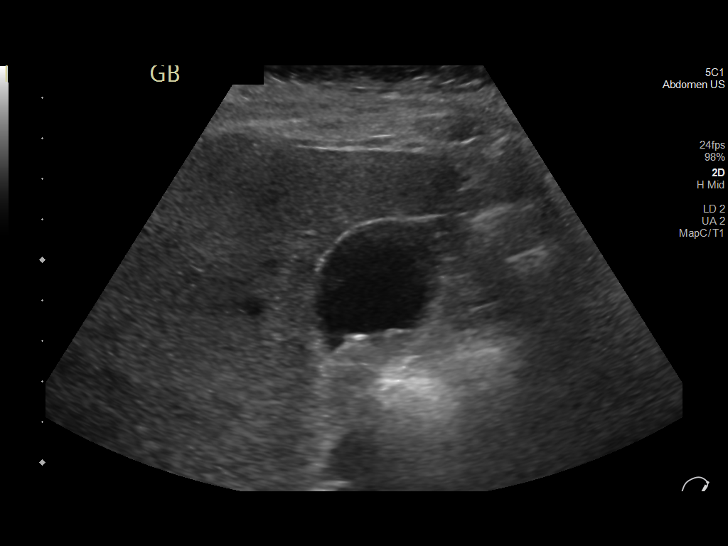
[im 21/31]
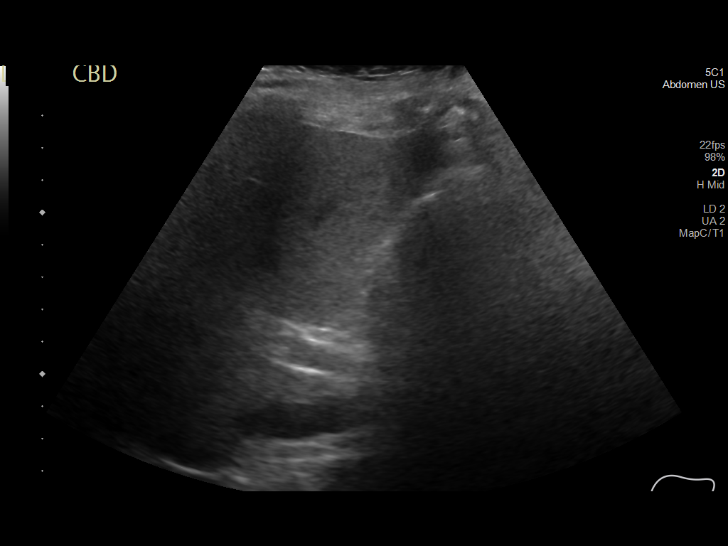
[im 23/31]
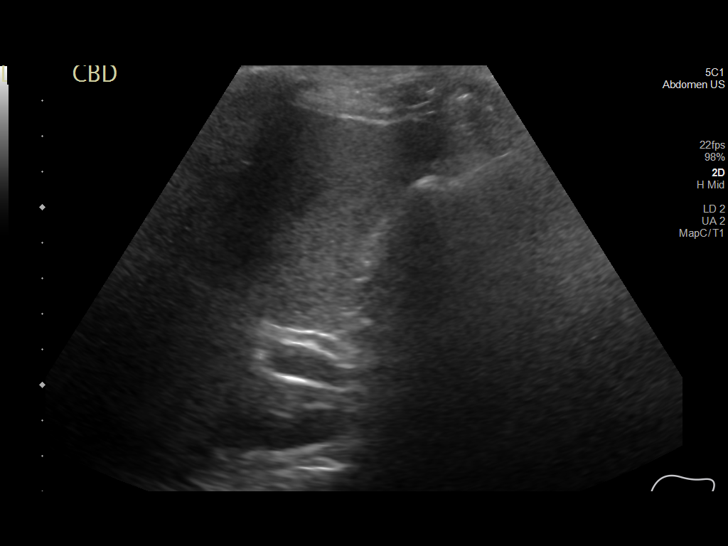
[im 26/31]
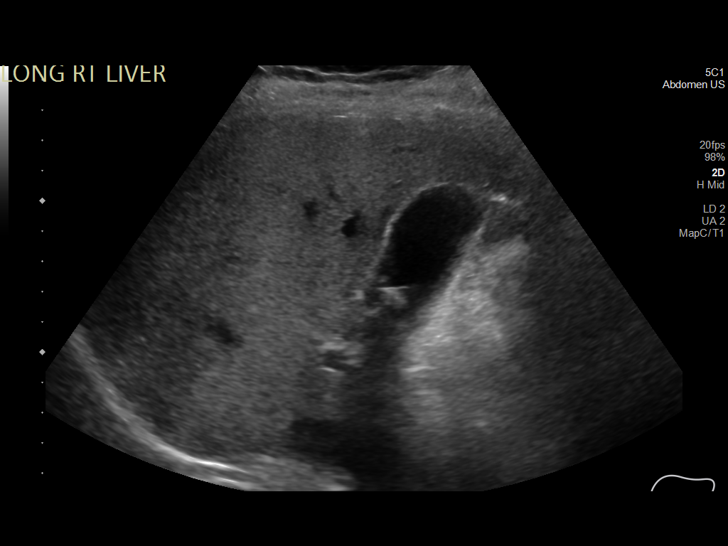
[im 28/31]
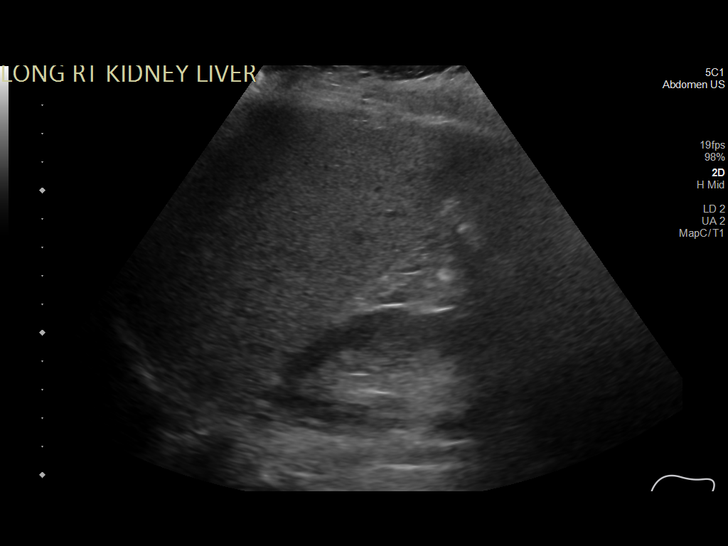
[im 31/31]
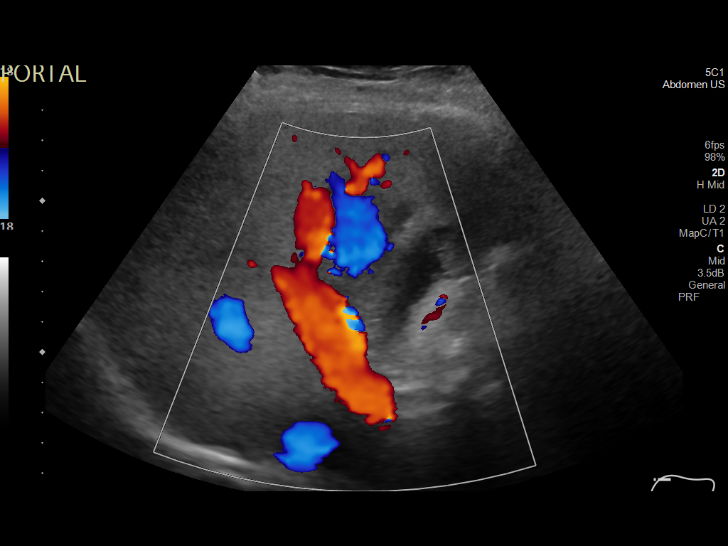

[14 of 25 positions shown; findings below may reference images not displayed]

FINDINGS: Gallbladder:

Calcified gallstones are noted within the gallbladder lumen. No
associated gallbladder wall thickening or pericholecystic fluid
visualized. No sonographic Murphy sign noted by sonographer.

Common bile duct:

Diameter: 3 mm.

Liver:

No focal lesion identified. Increased parenchymal echogenicity.
Portal vein is patent on color Doppler imaging with normal direction
of blood flow towards the liver.

Other: None.
IMPRESSION: 1. Cholelithiasis with no associated acute cholecystitis or
choledocholithiasis.
2. Hepatic steatosis. Please note limited evaluation for focal
hepatic masses in a patient with hepatic steatosis due to decreased
penetration of the acoustic ultrasound waves.

## 2022-05-23 IMAGING — MG MM DIGITAL SCREENING BILAT W/ TOMO AND CAD
6 of 10 series · 6 of 30 positions shown · non-contrast
Comparison: None.

CLINICAL DATA: Screening.

EXAM:
DIGITAL SCREENING BILATERAL MAMMOGRAM WITH TOMOSYNTHESIS AND CAD
TECHNIQUE: Bilateral screening digital craniocaudal and mediolateral oblique
mammograms were obtained. Bilateral screening digital breast
tomosynthesis was performed. The images were evaluated with
computer-aided detection.

[R MLO synth-2D]
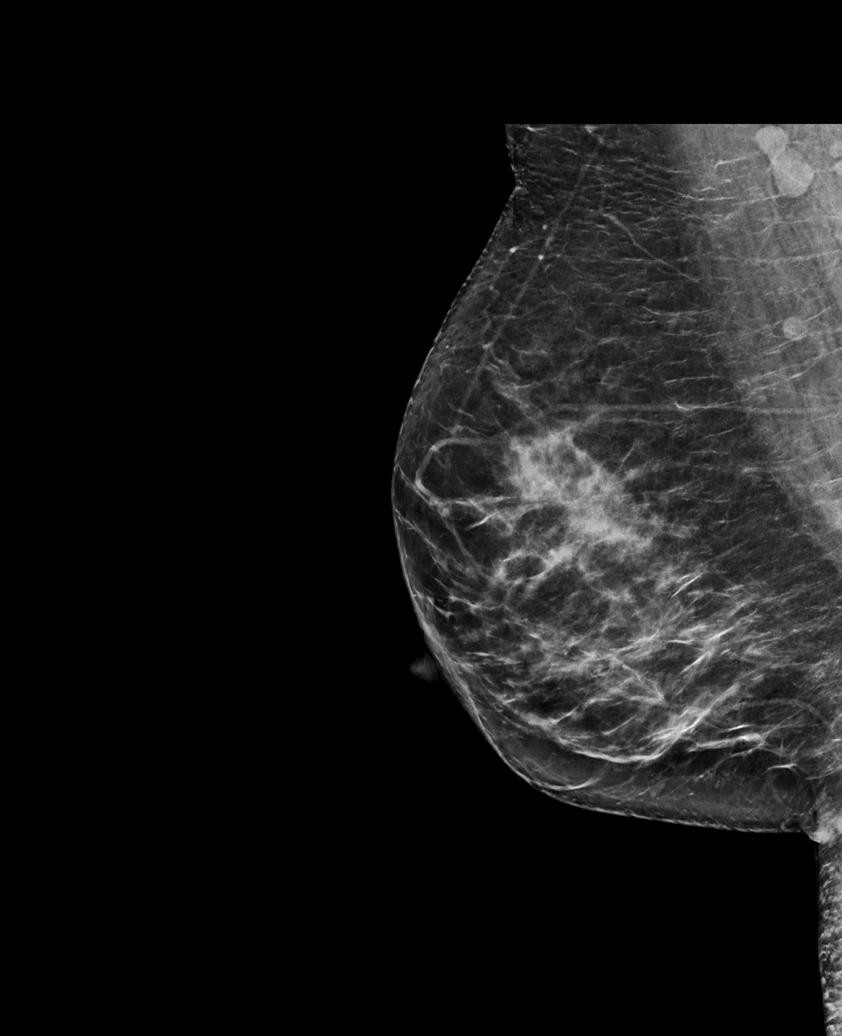

[L CC synth-2D]
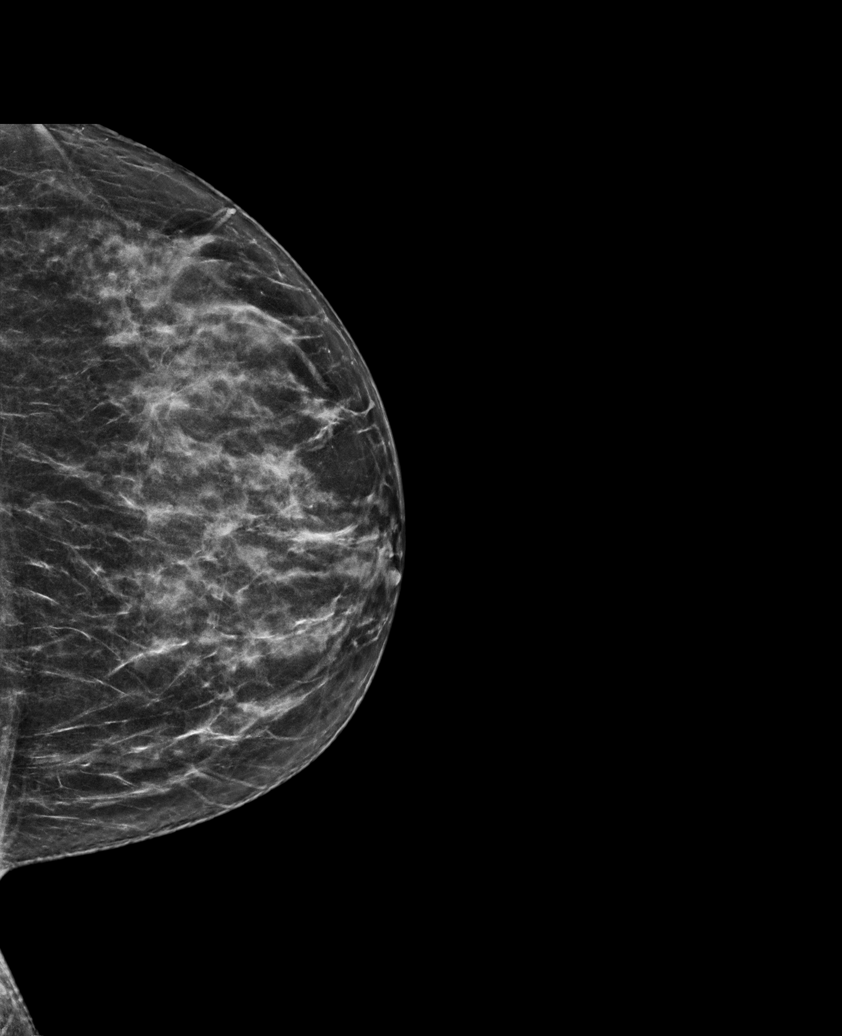

[R CC synth-2D]
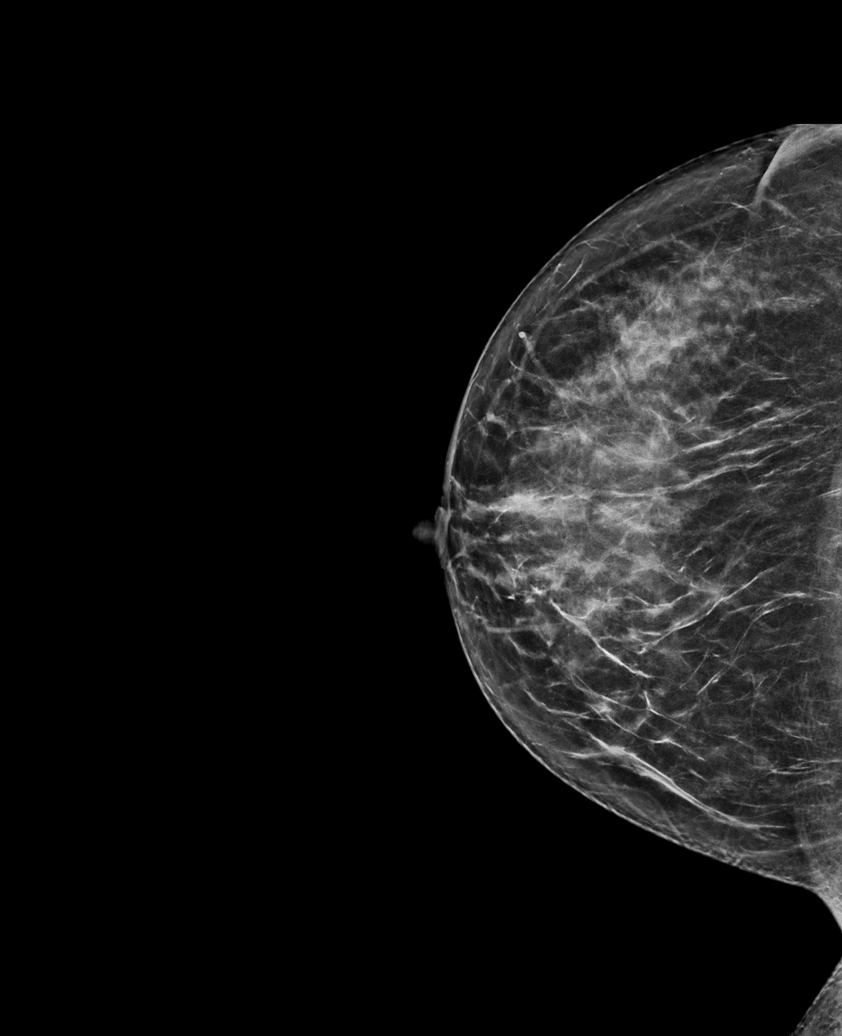

[L MLO synth-2D (1 of 2)]
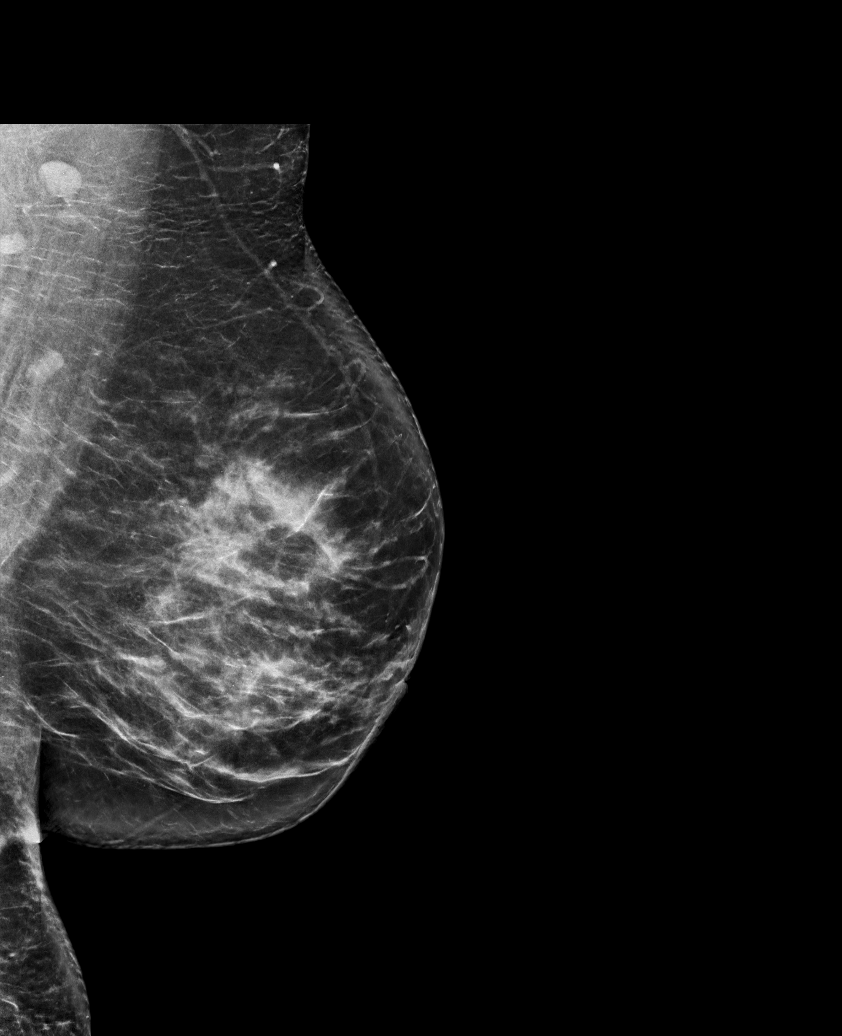

[L MLO synth-2D (2 of 2)]
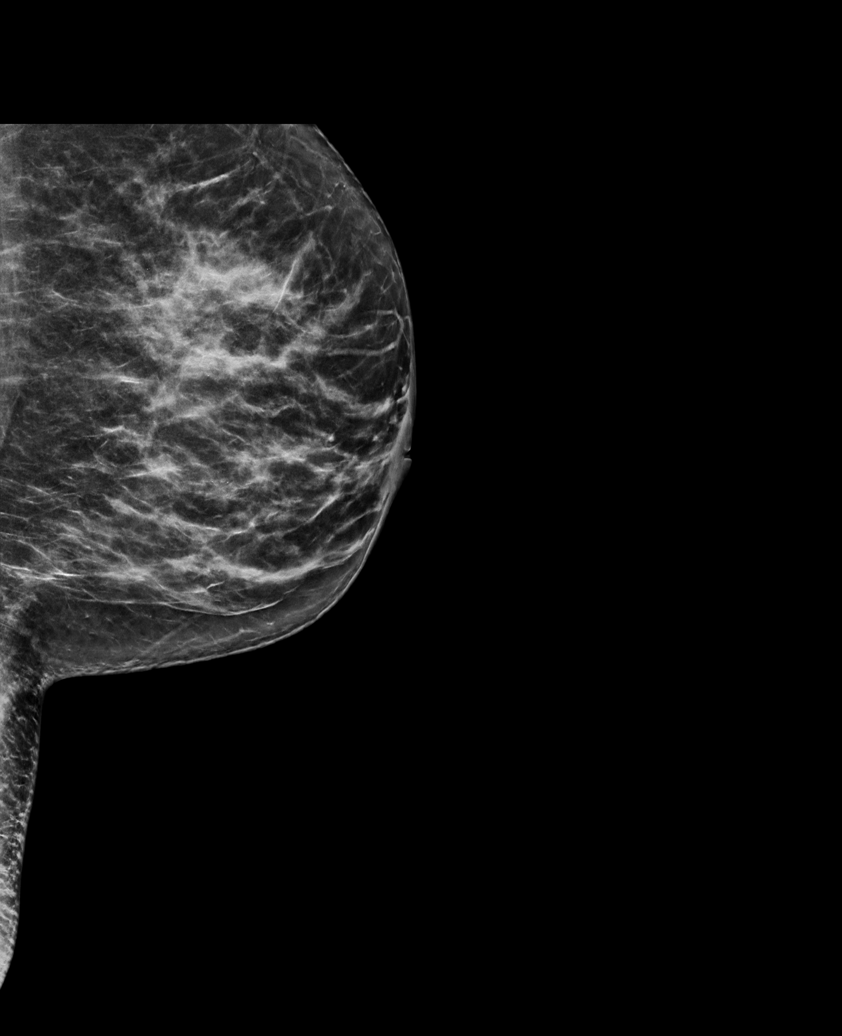

[L CC tomo · tomo slice 31/62.0]
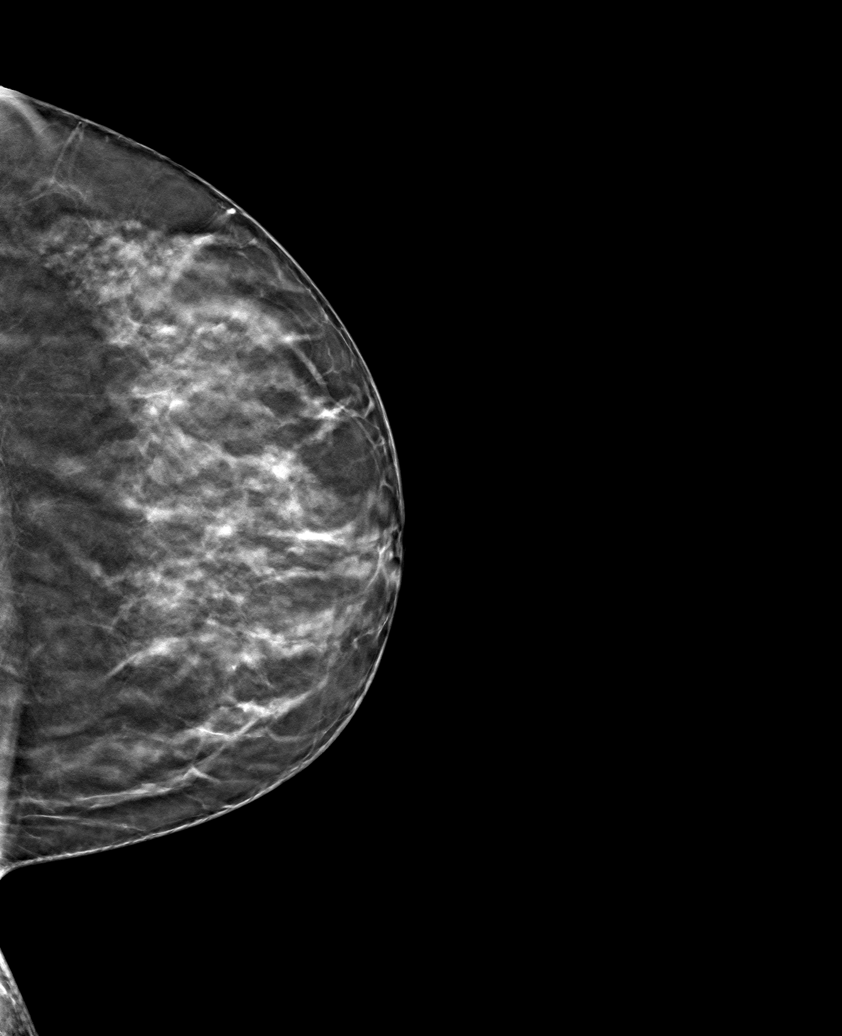

[6 of 30 positions shown; findings below may reference images not displayed]

ACR Breast Density Category c: The breast tissue is heterogeneously
dense, which may obscure small masses
FINDINGS: There are no findings suspicious for malignancy.
IMPRESSION: No mammographic evidence of malignancy. A result letter of this
screening mammogram will be mailed directly to the patient.

RECOMMENDATION:
Screening mammogram in one year. (Code:C8-T-HNK)

BI-RADS CATEGORY  1: Negative.

## 2024-01-27 ENCOUNTER — Other Ambulatory Visit: Payer: Self-pay

## 2024-01-27 ENCOUNTER — Emergency Department (HOSPITAL_COMMUNITY)
Admission: EM | Admit: 2024-01-27 | Discharge: 2024-01-27 | Disposition: A | Discharge status: Home / Self Care | Attending: Emergency Medicine | Admitting: Emergency Medicine

## 2024-01-27 ENCOUNTER — Encounter (HOSPITAL_COMMUNITY): Payer: Self-pay

## 2024-01-27 DIAGNOSIS — L239 Allergic contact dermatitis, unspecified cause: Secondary | ICD-10-CM | POA: Insufficient documentation

## 2024-01-27 DIAGNOSIS — R21 Rash and other nonspecific skin eruption: Secondary | ICD-10-CM | POA: Diagnosis present

## 2024-01-27 MED ORDER — DEXAMETHASONE SODIUM PHOSPHATE 10 MG/ML IJ SOLN
10.0000 mg | Freq: Once | INTRAMUSCULAR | Status: AC
  Administered 2024-01-27: 10 mg via INTRAVENOUS
  Filled 2024-01-27: qty 1

## 2024-01-27 MED ORDER — HYDROXYZINE HCL 25 MG PO TABS: 25.0000 mg | ORAL_TABLET | Freq: Four times a day (QID) | ORAL | 0 refills | Status: AC | PRN

## 2024-01-27 MED ORDER — DIPHENHYDRAMINE HCL 50 MG/ML IJ SOLN
25.0000 mg | Freq: Once | INTRAMUSCULAR | Status: AC
  Administered 2024-01-27: 25 mg via INTRAVENOUS
  Filled 2024-01-27: qty 1

## 2024-01-27 NOTE — ED Triage Notes (Signed)
 Pt arrived from home for allergic to unknown element. Symptoms started since Sunday.

## 2024-01-27 NOTE — ED Provider Notes (Signed)
 [place] EMERGENCY DEPARTMENT AT [facility] Provider Note   CSN: [id number] Arrival date & time: 01/27/24  0431     History  Chief Complaint  Patient presents with   Allergic Reaction    Ashley Blackburn is a 53 y.o. female.  Presents to the emergency department for evaluation of facial rash.  Symptoms began 4 days ago.  Patient with itchy red swollen rash on face.  She was seen at her clinic and was given Benadryl and prednisone.  She has also been using topical Benadryl and witch hazel, symptoms seem to have worsened.  No difficulty breathing, no difficulty swallowing or tongue swelling.  Rash is only on the face, not elsewhere.       Home Medications Prior to Admission medications   Medication Sig Start Date End Date Taking? Authorizing Provider  albuterol  (PROVENTIL  HFA;VENTOLIN  HFA) 108 (90 BASE) MCG/ACT inhaler Inhale 2 puffs into the lungs every 4 (four) hours as needed. 01/21/13   Media Spikes, MD  fluticasone  (FLONASE ) 50 MCG/ACT nasal spray Place 2 sprays into the nose daily. 01/21/13   Media Spikes, MD  HYDROcodone -acetaminophen  (NORCO/VICODIN) 5-325 MG tablet Take 1 tablet by mouth every 8 (eight) hours as needed. 03/09/21   Layden, Lindsey A, PA-C  loratadine (CLARITIN) 10 MG tablet Take 10 mg by mouth daily.    [provider]  ondansetron  (ZOFRAN  ODT) 4 MG disintegrating tablet Take 1 tablet (4 mg total) by mouth every 8 (eight) hours as needed for nausea or vomiting. 03/09/21   Valla Gauss, PA-C      Allergies    Patient has no known allergies.    Review of Systems   Review of Systems  Physical Exam Updated Vital Signs BP (!) 150/101 (BP Location: Left Arm)   Pulse 75   Temp 97.7 F (36.5 C) (Oral)   Resp (!) 23   Ht 5\' 4"  (1.626 m)   Wt 90.3 kg   SpO2 97%   BMI 34.16 kg/m  Physical Exam Vitals and nursing note reviewed.  Constitutional:      General: She is not in acute distress.    Appearance: She is  well-developed.  HENT:     Head: Normocephalic and atraumatic.     Mouth/Throat:     Mouth: Mucous membranes are moist.  Eyes:     General: Vision grossly intact. Gaze aligned appropriately.     Extraocular Movements: Extraocular movements intact.     Conjunctiva/sclera: Conjunctivae normal.  Cardiovascular:     Rate and Rhythm: Normal rate and regular rhythm.     Pulses: Normal pulses.     Heart sounds: Normal heart sounds, S1 normal and S2 normal. No murmur heard.    No friction rub. No gallop.  Pulmonary:     Effort: Pulmonary effort is normal. No respiratory distress.     Breath sounds: Normal breath sounds.  Abdominal:     General: Bowel sounds are normal.     Palpations: Abdomen is soft.     Tenderness: There is no abdominal tenderness. There is no guarding or rebound.     Hernia: No hernia is present.  Musculoskeletal:        General: No swelling.     Cervical back: Full passive range of motion without pain, normal range of motion and neck supple. No spinous process tenderness or muscular tenderness. Normal range of motion.     Right lower leg: No edema.     Left lower leg:  No edema.  Skin:    General: Skin is warm and dry.     Capillary Refill: Capillary refill takes less than 2 seconds.     Findings: Erythema present. No ecchymosis, rash or wound.  Neurological:     General: No focal deficit present.     Mental Status: She is alert and oriented to person, place, and time.     GCS: GCS eye subscore is 4. GCS verbal subscore is 5. GCS motor subscore is 6.     Cranial Nerves: Cranial nerves 2-12 are intact.     Sensory: Sensation is intact.     Motor: Motor function is intact.     Coordination: Coordination is intact.  Psychiatric:        Attention and Perception: Attention normal.        Mood and Affect: Mood normal.        Speech: Speech normal.        Behavior: Behavior normal.     ED Results / Procedures / Treatments   Labs (all labs ordered are listed, but  only abnormal results are displayed) Labs Reviewed - No data to display  EKG EKG Interpretation Date/Time:  Wednesday January 27 2024 04:42:21 EDT Ventricular Rate:  77 PR Interval:  148 QRS Duration:  88 QT Interval:  365 QTC Calculation: 413 R Axis:   88  Text Interpretation: Sinus rhythm Probable anteroseptal infarct, old Confirmed by Ballard Bongo [id number]) on 01/27/2024 5:47:17 AM  Radiology No results found.  Procedures Procedures    Medications Ordered in ED Medications  dexamethasone (DECADRON) injection 10 mg (10 mg Intravenous Given 01/27/24 0454)  diphenhydrAMINE (BENADRYL) injection 25 mg (25 mg Intravenous Given 01/27/24 0455)    ED Course/ Medical Decision Making/ A&P                                 Medical Decision Making Risk Prescription drug management.   Patient presents to the department with concerns over allergic reaction.  Patient with erythema, swelling of the face.  There is no rash or skin findings other than on the face and the areas very well-demarcated at the neck.  This appears to be contact dermatitis secondary to something that touched her face.  She is unaware of any new products or anything that could have caused the reaction.  She is already on steroids.  Given IV steroids here in the ED and antihistamine for itching relief.        Final Clinical Impression(s) / ED Diagnoses Final diagnoses:  Allergic contact dermatitis, unspecified trigger    Rx / DC Orders ED Discharge Orders     None         Richardo Popoff, Marine Sia, MD 01/27/24 [id number]

## 2024-02-29 ENCOUNTER — Other Ambulatory Visit: Payer: Self-pay

## 2024-02-29 ENCOUNTER — Emergency Department (HOSPITAL_COMMUNITY)
Admission: EM | Admit: 2024-02-29 | Discharge: 2024-02-29 | Disposition: A | Discharge status: Home / Self Care | Attending: Emergency Medicine | Admitting: Emergency Medicine

## 2024-02-29 ENCOUNTER — Encounter (HOSPITAL_COMMUNITY): Payer: Self-pay

## 2024-02-29 ENCOUNTER — Emergency Department (HOSPITAL_COMMUNITY)

## 2024-02-29 DIAGNOSIS — S20212A Contusion of left front wall of thorax, initial encounter: Secondary | ICD-10-CM | POA: Insufficient documentation

## 2024-02-29 DIAGNOSIS — S9001XA Contusion of right ankle, initial encounter: Secondary | ICD-10-CM | POA: Diagnosis not present

## 2024-02-29 DIAGNOSIS — W109XXA Fall (on) (from) unspecified stairs and steps, initial encounter: Secondary | ICD-10-CM | POA: Insufficient documentation

## 2024-02-29 DIAGNOSIS — S5011XA Contusion of right forearm, initial encounter: Secondary | ICD-10-CM | POA: Diagnosis not present

## 2024-02-29 DIAGNOSIS — M25461 Effusion, right knee: Secondary | ICD-10-CM | POA: Insufficient documentation

## 2024-02-29 DIAGNOSIS — R0781 Pleurodynia: Secondary | ICD-10-CM | POA: Diagnosis present

## 2024-02-29 DIAGNOSIS — W108XXA Fall (on) (from) other stairs and steps, initial encounter: Secondary | ICD-10-CM

## 2024-02-29 MED ORDER — IBUPROFEN 800 MG PO TABS
800.0000 mg | ORAL_TABLET | Freq: Once | ORAL | Status: AC
  Administered 2024-02-29: 800 mg via ORAL
  Filled 2024-02-29: qty 1

## 2024-02-29 NOTE — ED Triage Notes (Signed)
 PT arrives via POV. Interpretor offered, pt declined and requested that her daughter translate for her. Pt reports she accidentally tripped and fell down about 8 steps yesterday. Pt denies hitting her head, no loc, no blood thinners, and pt is AxOx4. PT c/o pain to right knee, right arm where an abrasion is noted, and pain to left side of rib cage.

## 2024-02-29 NOTE — ED Provider Notes (Signed)
 [facility] Provider Note   CSN: [id number] Arrival date & time: 02/29/24  4098     History  Chief Complaint  Patient presents with   Ashley Blackburn    Ashley Blackburn is a 53 y.o. female.  53 year old female presents for evaluation after fall downstairs yesterday.  Patient states that she thought she was opening a door to go into the bathroom however at the door she opened went to the basement and she fell down a flight of stairs.  She states that she did hit her head but did not lose consciousness, is not on blood thinners and denies headache today.  She presents with primary complaint of pain in her right knee, worse with bearing weight.  Also pain in her left lower ribs, worse with taking a deep breath.  No abdominal pain, no vomiting. Also has bruising to her right elbow but states that her elbow is fine, bruising to her lateral right ankle but also feels as if her ankle is fine.  No other injuries, complaints, concerns.  Patient's daughter is at bedside and assists with translation although patient does well communicating on her own.       Home Medications Prior to Admission medications   Medication Sig Start Date End Date Taking? Authorizing Provider  albuterol  (PROVENTIL  HFA;VENTOLIN  HFA) 108 (90 BASE) MCG/ACT inhaler Inhale 2 puffs into the lungs every 4 (four) hours as needed. 01/21/13   Media Spikes, MD  fluticasone  (FLONASE ) 50 MCG/ACT nasal spray Place 2 sprays into the nose daily. 01/21/13   Media Spikes, MD  HYDROcodone -acetaminophen  (NORCO/VICODIN) 5-325 MG tablet Take 1 tablet by mouth every 8 (eight) hours as needed. 03/09/21   Layden, Lindsey A, PA-C  hydrOXYzine  (ATARAX ) 25 MG tablet Take 1-2 tablets (25-50 mg total) by mouth every 6 (six) hours as needed for itching. 01/27/24   Ballard Bongo, MD  loratadine (CLARITIN) 10 MG tablet Take 10 mg by mouth daily.    [provider]  ondansetron  (ZOFRAN  ODT) 4  MG disintegrating tablet Take 1 tablet (4 mg total) by mouth every 8 (eight) hours as needed for nausea or vomiting. 03/09/21   Valla Gauss, PA-C      Allergies    Patient has no known allergies.    Review of Systems   Review of Systems Negative except as per HPI Physical Exam Updated Vital Signs BP (!) 154/92   Pulse 75   Temp 98.2 F (36.8 C) (Oral)   Resp 15   SpO2 96%  Physical Exam Vitals and nursing note reviewed.  Constitutional:      General: She is not in acute distress.    Appearance: She is well-developed. She is not diaphoretic.  HENT:     Head: Normocephalic and atraumatic.  Cardiovascular:     Pulses: Normal pulses.  Pulmonary:     Effort: Pulmonary effort is normal.  Chest:     Chest wall: Tenderness present.    Abdominal:     Palpations: Abdomen is soft.     Tenderness: There is no abdominal tenderness.  Musculoskeletal:        General: Swelling, tenderness and signs of injury present. No deformity.     Right shoulder: Normal.     Left shoulder: Normal.     Right elbow: Normal.     Left elbow: Normal.     Right wrist: Normal.     Left wrist: Normal.  Arms:     Cervical back: No tenderness or bony tenderness. No pain with movement. Normal range of motion.     Thoracic back: No tenderness or bony tenderness.     Lumbar back: No tenderness or bony tenderness.     Right hip: Normal.     Left hip: Normal.     Right knee: Bony tenderness present. No crepitus. Decreased range of motion.     Left knee: Normal.     Right ankle: Ecchymosis present. No swelling or deformity. No tenderness. Normal range of motion.     Left ankle: Normal.     Right foot: Normal.     Left foot: Normal.       Legs:  Skin:    General: Skin is warm and dry.     Findings: Bruising present.  Neurological:     Mental Status: She is alert and oriented to person, place, and time.     Sensory: No sensory deficit.     Motor: No weakness.  Psychiatric:         Behavior: Behavior normal.     ED Results / Procedures / Treatments   Labs (all labs ordered are listed, but only abnormal results are displayed) Labs Reviewed - No data to display  EKG None  Radiology DG Ribs Unilateral W/Chest Left Result Date: 02/29/2024 CLINICAL DATA:  fall, pain EXAM: LEFT RIBS AND CHEST - 3+ VIEW COMPARISON:  None available FINDINGS: Elevation of the right hemidiaphragm. No focal airspace consolidation, pleural effusion, or pneumothorax. No cardiomegaly. No acute, displaced rib fracture or destructive lesion. Multilevel thoracic osteophytosis. IMPRESSION: No acute, displaced rib fracture. Otherwise, clear lungs. Electronically Signed   By: Rance Burrows M.D.   On: 02/29/2024 10:44   DG Knee Complete 4 Views Right Result Date: 02/29/2024 CLINICAL DATA:  Knee pain status post fall EXAM: RIGHT KNEE - COMPLETE 4+ VIEW COMPARISON:  None Available. FINDINGS: No evidence of fracture, dislocation, or joint effusion. No evidence of arthropathy or other focal bone abnormality. Soft tissues are unremarkable. Small suprapatellar joint effusion IMPRESSION: Small joint effusion.  No fractures Electronically Signed   By: Fredrich Jefferson M.D.   On: 02/29/2024 10:35    Procedures Procedures    Medications Ordered in ED Medications  ibuprofen (ADVIL) tablet 800 mg (800 mg Oral Given 02/29/24 1127)    ED Course/ Medical Decision Making/ A&P                                 Medical Decision Making Amount and/or Complexity of Data Reviewed Radiology: ordered.  Risk Prescription drug management.   53 year old female presents for evaluation after a fall which occurred yesterday down a flight of stairs.  She has pain along her left anterior midclavicular lower ribs, x-ray of this area is negative for identifiable rib fracture, agree with radiologist interpretation.  She also has pain in her right knee, x-ray of the right knee shows a small effusion, agree with radiologist  interpretation.  Patient is provided with knee sleeve and crutches.  Recommend RICE, referred to orthopedics for recheck with return to ER precautions.        Final Clinical Impression(s) / ED Diagnoses Final diagnoses:  Fall down stairs, initial encounter  Contusion of left chest wall, initial encounter  Effusion of right knee    Rx / DC Orders ED Discharge Orders     None  Darlis Eisenmenger, PA-C 02/29/24 1408    Deatra Face, MD 03/01/24 1450

## 2024-02-29 NOTE — ED Triage Notes (Deleted)
 Pt presents to the ED following a fall this morning down her basement steps. Pt states that she did hit her head but denies LOC and blood thinner usage. Pt is expressing pain on the left side of her abdomen as well as right knee pain that is painful to walk on. Aox4 upon arrival.

## 2024-02-29 NOTE — Discharge Instructions (Signed)
 Regrese a urgencias si presenta sntomas nuevos o empeora. Acuda a un especialista en ortopedia si el dolor de rodilla no mejora en una semana. Tome Motrin y Tylenol  segn sea necesario, segn las indicaciones. Aplique hielo durante 20 minutos seguidos sobre un pao fino para Engineer, materials y la inflamacin.  Return to ER for new or worsening symptoms. Follow up with orthopedics for knee pain not improving in 1 week. Take Motrin and Tylenol  as needed as directed.  Apply ice for 20 minutes at a time over a thin cloth, to help with pain and swelling.
